# Patient Record
Sex: Male | Born: 1980 | Race: White | Hispanic: No | Marital: Married | Smoking: Never smoker
Health system: Southern US, Community
[De-identification: ages and names within clinical notes are randomized; demographics above are authoritative.]

---

## 2006-06-10 ENCOUNTER — Ambulatory Visit: Payer: Self-pay | Admitting: Internal Medicine

## 2006-06-23 ENCOUNTER — Ambulatory Visit: Payer: Self-pay | Admitting: Internal Medicine

## 2006-06-28 ENCOUNTER — Ambulatory Visit: Payer: Self-pay

## 2006-06-28 ENCOUNTER — Ambulatory Visit: Payer: Self-pay | Admitting: *Deleted

## 2006-06-29 ENCOUNTER — Ambulatory Visit: Payer: Self-pay | Admitting: Internal Medicine

## 2006-07-02 ENCOUNTER — Ambulatory Visit: Payer: Self-pay | Admitting: Internal Medicine

## 2006-07-11 ENCOUNTER — Ambulatory Visit: Payer: Self-pay | Admitting: Cardiology

## 2006-07-11 ENCOUNTER — Ambulatory Visit: Payer: Self-pay | Admitting: Internal Medicine

## 2006-07-14 ENCOUNTER — Ambulatory Visit: Payer: Self-pay | Admitting: Internal Medicine

## 2006-07-26 ENCOUNTER — Ambulatory Visit: Payer: Self-pay | Admitting: Internal Medicine

## 2006-08-12 ENCOUNTER — Ambulatory Visit: Payer: Self-pay | Admitting: Internal Medicine

## 2009-09-02 ENCOUNTER — Telehealth (INDEPENDENT_AMBULATORY_CARE_PROVIDER_SITE_OTHER): Payer: Self-pay | Admitting: *Deleted

## 2016-01-23 ENCOUNTER — Emergency Department (HOSPITAL_COMMUNITY): Payer: Commercial Managed Care - PPO

## 2016-01-23 ENCOUNTER — Encounter (HOSPITAL_COMMUNITY): Admission: EM | Disposition: A | Discharge status: Home / Self Care | Payer: Self-pay | Source: Home / Self Care

## 2016-01-23 ENCOUNTER — Inpatient Hospital Stay (HOSPITAL_COMMUNITY)
Admission: EM | Admit: 2016-01-23 | Discharge: 2016-02-01 | DRG: 958 | Disposition: A | Discharge status: Home / Self Care | Payer: Commercial Managed Care - PPO | Attending: Surgery | Admitting: Surgery

## 2016-01-23 ENCOUNTER — Encounter (HOSPITAL_COMMUNITY): Payer: Self-pay | Admitting: Emergency Medicine

## 2016-01-23 DIAGNOSIS — S01511A Laceration without foreign body of lip, initial encounter: Secondary | ICD-10-CM | POA: Diagnosis present

## 2016-01-23 DIAGNOSIS — S12400A Unspecified displaced fracture of fifth cervical vertebra, initial encounter for closed fracture: Principal | ICD-10-CM | POA: Diagnosis present

## 2016-01-23 DIAGNOSIS — S13171A Dislocation of C6/C7 cervical vertebrae, initial encounter: Secondary | ICD-10-CM | POA: Diagnosis present

## 2016-01-23 DIAGNOSIS — Y92410 Unspecified street and highway as the place of occurrence of the external cause: Secondary | ICD-10-CM

## 2016-01-23 DIAGNOSIS — Y990 Civilian activity done for income or pay: Secondary | ICD-10-CM

## 2016-01-23 DIAGNOSIS — R402433 Glasgow coma scale score 3-8, at hospital admission: Secondary | ICD-10-CM | POA: Diagnosis present

## 2016-01-23 DIAGNOSIS — S01312A Laceration without foreign body of left ear, initial encounter: Secondary | ICD-10-CM | POA: Diagnosis present

## 2016-01-23 DIAGNOSIS — S0181XA Laceration without foreign body of other part of head, initial encounter: Secondary | ICD-10-CM | POA: Diagnosis present

## 2016-01-23 DIAGNOSIS — S2231XA Fracture of one rib, right side, initial encounter for closed fracture: Secondary | ICD-10-CM | POA: Diagnosis present

## 2016-01-23 DIAGNOSIS — S069X9A Unspecified intracranial injury with loss of consciousness of unspecified duration, initial encounter: Secondary | ICD-10-CM | POA: Diagnosis present

## 2016-01-23 DIAGNOSIS — Z419 Encounter for procedure for purposes other than remedying health state, unspecified: Secondary | ICD-10-CM

## 2016-01-23 DIAGNOSIS — M25472 Effusion, left ankle: Secondary | ICD-10-CM

## 2016-01-23 DIAGNOSIS — S271XXA Traumatic hemothorax, initial encounter: Secondary | ICD-10-CM | POA: Diagnosis present

## 2016-01-23 DIAGNOSIS — Y93H2 Activity, gardening and landscaping: Secondary | ICD-10-CM

## 2016-01-23 DIAGNOSIS — R319 Hematuria, unspecified: Secondary | ICD-10-CM | POA: Diagnosis not present

## 2016-01-23 DIAGNOSIS — S22079A Unspecified fracture of T9-T10 vertebra, initial encounter for closed fracture: Secondary | ICD-10-CM | POA: Diagnosis present

## 2016-01-23 DIAGNOSIS — K5903 Drug induced constipation: Secondary | ICD-10-CM | POA: Diagnosis not present

## 2016-01-23 DIAGNOSIS — G629 Polyneuropathy, unspecified: Secondary | ICD-10-CM | POA: Diagnosis present

## 2016-01-23 DIAGNOSIS — S22009A Unspecified fracture of unspecified thoracic vertebra, initial encounter for closed fracture: Secondary | ICD-10-CM

## 2016-01-23 DIAGNOSIS — Z978 Presence of other specified devices: Secondary | ICD-10-CM

## 2016-01-23 DIAGNOSIS — K5981 Ogilvie syndrome: Secondary | ICD-10-CM | POA: Diagnosis not present

## 2016-01-23 DIAGNOSIS — S27329A Contusion of lung, unspecified, initial encounter: Secondary | ICD-10-CM

## 2016-01-23 DIAGNOSIS — R109 Unspecified abdominal pain: Secondary | ICD-10-CM

## 2016-01-23 DIAGNOSIS — S8265XA Nondisplaced fracture of lateral malleolus of left fibula, initial encounter for closed fracture: Secondary | ICD-10-CM | POA: Diagnosis present

## 2016-01-23 DIAGNOSIS — K598 Other specified functional intestinal disorders: Secondary | ICD-10-CM

## 2016-01-23 DIAGNOSIS — S129XXA Fracture of neck, unspecified, initial encounter: Secondary | ICD-10-CM | POA: Diagnosis present

## 2016-01-23 DIAGNOSIS — K567 Ileus, unspecified: Secondary | ICD-10-CM | POA: Diagnosis not present

## 2016-01-23 DIAGNOSIS — M25572 Pain in left ankle and joints of left foot: Secondary | ICD-10-CM

## 2016-01-23 DIAGNOSIS — T40605A Adverse effect of unspecified narcotics, initial encounter: Secondary | ICD-10-CM | POA: Diagnosis not present

## 2016-01-23 DIAGNOSIS — D62 Acute posthemorrhagic anemia: Secondary | ICD-10-CM | POA: Diagnosis present

## 2016-01-23 HISTORY — PX: ANTERIOR CERVICAL DECOMP/DISCECTOMY FUSION: SHX1161

## 2016-01-23 LAB — CBC
HEMATOCRIT: 45.1 % (ref 39.0–52.0)
HEMOGLOBIN: 15.1 g/dL (ref 13.0–17.0)
MCH: 31.2 pg (ref 26.0–34.0)
MCHC: 33.5 g/dL (ref 30.0–36.0)
MCV: 93.2 fL (ref 78.0–100.0)
PLATELETS: 284 10*3/uL (ref 150–400)
RBC: 4.84 MIL/uL (ref 4.22–5.81)
RDW: 12 % (ref 11.5–15.5)
WBC: 13.6 10*3/uL — AB (ref 4.0–10.5)

## 2016-01-23 LAB — PROTIME-INR
INR: 1.14 (ref 0.00–1.49)
Prothrombin Time: 14.8 seconds (ref 11.6–15.2)

## 2016-01-23 LAB — COMPREHENSIVE METABOLIC PANEL
ALT: 79 U/L — AB (ref 17–63)
ANION GAP: 9 (ref 5–15)
AST: 112 U/L — ABNORMAL HIGH (ref 15–41)
Albumin: 4.2 g/dL (ref 3.5–5.0)
Alkaline Phosphatase: 63 U/L (ref 38–126)
BUN: 22 mg/dL — ABNORMAL HIGH (ref 6–20)
CHLORIDE: 105 mmol/L (ref 101–111)
CO2: 25 mmol/L (ref 22–32)
CREATININE: 1.12 mg/dL (ref 0.61–1.24)
Calcium: 9.2 mg/dL (ref 8.9–10.3)
Glucose, Bld: 122 mg/dL — ABNORMAL HIGH (ref 65–99)
POTASSIUM: 3.3 mmol/L — AB (ref 3.5–5.1)
SODIUM: 139 mmol/L (ref 135–145)
Total Bilirubin: 0.7 mg/dL (ref 0.3–1.2)
Total Protein: 6.7 g/dL (ref 6.5–8.1)

## 2016-01-23 LAB — I-STAT CHEM 8, ED
BUN: 27 mg/dL — ABNORMAL HIGH (ref 6–20)
CREATININE: 1.1 mg/dL (ref 0.61–1.24)
Calcium, Ion: 1.14 mmol/L (ref 1.12–1.23)
Chloride: 102 mmol/L (ref 101–111)
GLUCOSE: 111 mg/dL — AB (ref 65–99)
HCT: 49 % (ref 39.0–52.0)
HEMOGLOBIN: 16.7 g/dL (ref 13.0–17.0)
POTASSIUM: 3.3 mmol/L — AB (ref 3.5–5.1)
Sodium: 140 mmol/L (ref 135–145)
TCO2: 26 mmol/L (ref 0–100)

## 2016-01-23 LAB — TYPE AND SCREEN
ABO/RH(D): O POS
ANTIBODY SCREEN: NEGATIVE

## 2016-01-23 LAB — ABO/RH: ABO/RH(D): O POS

## 2016-01-23 LAB — I-STAT CG4 LACTIC ACID, ED: Lactic Acid, Venous: 1.73 mmol/L (ref 0.5–2.0)

## 2016-01-23 SURGERY — ANTERIOR CERVICAL DECOMPRESSION/DISCECTOMY FUSION 1 LEVEL/HARDWARE REMOVAL
Anesthesia: General | Site: Neck

## 2016-01-23 MED ORDER — TETANUS-DIPHTH-ACELL PERTUSSIS 5-2.5-18.5 LF-MCG/0.5 IM SUSP
0.5000 mL | Freq: Once | INTRAMUSCULAR | Status: AC
  Administered 2016-01-23: 0.5 mL via INTRAMUSCULAR
  Filled 2016-01-23: qty 0.5

## 2016-01-23 MED ORDER — HYDROMORPHONE HCL 1 MG/ML IJ SOLN
INTRAMUSCULAR | Status: AC
  Filled 2016-01-23: qty 1

## 2016-01-23 MED ORDER — FENTANYL CITRATE (PF) 100 MCG/2ML IJ SOLN
INTRAMUSCULAR | Status: AC | PRN
  Administered 2016-01-23: 50 ug via INTRAVENOUS

## 2016-01-23 MED ORDER — PROPARACAINE HCL 0.5 % OP SOLN
2.0000 [drp] | Freq: Once | OPHTHALMIC | Status: AC
  Administered 2016-01-23: 2 [drp] via OPHTHALMIC
  Filled 2016-01-23: qty 15

## 2016-01-23 MED ORDER — ONDANSETRON HCL 4 MG/2ML IJ SOLN
4.0000 mg | Freq: Four times a day (QID) | INTRAMUSCULAR | Status: DC | PRN
  Administered 2016-01-27 – 2016-01-28 (×2): 4 mg via INTRAVENOUS
  Filled 2016-01-23 (×2): qty 2

## 2016-01-23 MED ORDER — HYDROMORPHONE HCL 1 MG/ML IJ SOLN: 1.0000 mg | INTRAMUSCULAR | Status: DC | PRN

## 2016-01-23 MED ORDER — CEFAZOLIN SODIUM 1-5 GM-% IV SOLN
1.0000 g | Freq: Once | INTRAVENOUS | Status: AC
  Administered 2016-01-23: 1 g via INTRAVENOUS
  Filled 2016-01-23: qty 50

## 2016-01-23 MED ORDER — FENTANYL CITRATE (PF) 100 MCG/2ML IJ SOLN
INTRAMUSCULAR | Status: AC
  Filled 2016-01-23: qty 2

## 2016-01-23 MED ORDER — LIDOCAINE HCL (PF) 1 % IJ SOLN
30.0000 mL | Freq: Once | INTRAMUSCULAR | Status: AC
  Administered 2016-01-24: 30 mL
  Filled 2016-01-23: qty 30

## 2016-01-23 MED ORDER — HYDROMORPHONE HCL 1 MG/ML IJ SOLN
1.0000 mg | INTRAMUSCULAR | Status: DC | PRN
  Filled 2016-01-23 (×2): qty 1

## 2016-01-23 MED ORDER — FENTANYL CITRATE (PF) 100 MCG/2ML IJ SOLN
50.0000 ug | Freq: Once | INTRAMUSCULAR | Status: AC
  Administered 2016-01-24: 150 ug via INTRAVENOUS

## 2016-01-23 MED ORDER — HYDROMORPHONE HCL 1 MG/ML IJ SOLN
INTRAMUSCULAR | Status: AC | PRN
  Administered 2016-01-23: 1 mg via INTRAVENOUS

## 2016-01-23 MED ORDER — ONDANSETRON HCL 4 MG PO TABS
4.0000 mg | ORAL_TABLET | Freq: Four times a day (QID) | ORAL | Status: DC | PRN
  Administered 2016-02-01: 4 mg via ORAL
  Filled 2016-01-23: qty 1

## 2016-01-23 MED ORDER — LIDOCAINE HCL (PF) 1 % IJ SOLN: 5.0000 mL | Freq: Once | INTRAMUSCULAR | Status: DC

## 2016-01-23 MED ORDER — MIDAZOLAM HCL 2 MG/2ML IJ SOLN
INTRAMUSCULAR | Status: AC
  Filled 2016-01-23: qty 2

## 2016-01-23 MED ORDER — PROPOFOL 10 MG/ML IV BOLUS
INTRAVENOUS | Status: AC
  Filled 2016-01-23: qty 20

## 2016-01-23 MED ORDER — HYDROMORPHONE HCL 1 MG/ML IJ SOLN
1.0000 mg | Freq: Once | INTRAMUSCULAR | Status: AC
  Administered 2016-01-23: 1 mg via INTRAVENOUS

## 2016-01-23 MED ORDER — FENTANYL CITRATE (PF) 250 MCG/5ML IJ SOLN
INTRAMUSCULAR | Status: AC
  Filled 2016-01-23: qty 5

## 2016-01-23 MED ORDER — IOPAMIDOL (ISOVUE-300) INJECTION 61%
INTRAVENOUS | Status: AC
  Administered 2016-01-23: 100 mL
  Filled 2016-01-23: qty 100

## 2016-01-23 MED ORDER — HYDROMORPHONE HCL 1 MG/ML IJ SOLN: 1.0000 mg | Freq: Once | INTRAMUSCULAR | Status: DC

## 2016-01-23 SURGICAL SUPPLY — 75 items
BIT DRILL NEURO 2X3.1 SFT TUCH (MISCELLANEOUS) ×2 IMPLANT
BLADE CLIPPER SURG (BLADE) IMPLANT
BNDG GAUZE ELAST 4 BULKY (GAUZE/BANDAGES/DRESSINGS) IMPLANT
BUR DRUM 4.0 (BURR) IMPLANT
BUR DRUM 4.0MM (BURR)
CANISTER SUCT 3000ML PPV (MISCELLANEOUS) ×4 IMPLANT
DECANTER SPIKE VIAL GLASS SM (MISCELLANEOUS) ×4 IMPLANT
DERMABOND ADVANCED (GAUZE/BANDAGES/DRESSINGS) ×2
DERMABOND ADVANCED .7 DNX12 (GAUZE/BANDAGES/DRESSINGS) ×2 IMPLANT
DRAPE C-ARM 42X72 X-RAY (DRAPES) ×8 IMPLANT
DRAPE LAPAROTOMY 100X72 PEDS (DRAPES) ×4 IMPLANT
DRAPE LAPAROTOMY 100X72X124 (DRAPES) ×4 IMPLANT
DRAPE MICROSCOPE LEICA (MISCELLANEOUS) ×4 IMPLANT
DRAPE POUCH INSTRU U-SHP 10X18 (DRAPES) ×4 IMPLANT
DRAPE PROXIMA HALF (DRAPES) ×4 IMPLANT
DRILL NEURO 2X3.1 SOFT TOUCH (MISCELLANEOUS) ×4
DURAPREP 6ML APPLICATOR 50/CS (WOUND CARE) ×4 IMPLANT
DURASEAL APPLICATOR TIP (TIP) ×4 IMPLANT
DURASEAL SPINE SEALANT 3ML (MISCELLANEOUS) ×4 IMPLANT
ELECT COATED BLADE 2.86 ST (ELECTRODE) ×4 IMPLANT
ELECT REM PT RETURN 9FT ADLT (ELECTROSURGICAL) ×4
ELECTRODE REM PT RTRN 9FT ADLT (ELECTROSURGICAL) ×2 IMPLANT
GAUZE SPONGE 4X4 16PLY XRAY LF (GAUZE/BANDAGES/DRESSINGS) IMPLANT
GLOVE BIO SURGEON STRL SZ 6.5 (GLOVE) IMPLANT
GLOVE BIO SURGEON STRL SZ7 (GLOVE) ×8 IMPLANT
GLOVE BIO SURGEON STRL SZ7.5 (GLOVE) ×4 IMPLANT
GLOVE BIO SURGEON STRL SZ8 (GLOVE) IMPLANT
GLOVE BIO SURGEON STRL SZ8.5 (GLOVE) IMPLANT
GLOVE BIO SURGEONS STRL SZ 6.5 (GLOVE)
GLOVE BIOGEL M 8.0 STRL (GLOVE) IMPLANT
GLOVE ECLIPSE 6.5 STRL STRAW (GLOVE) ×4 IMPLANT
GLOVE ECLIPSE 7.0 STRL STRAW (GLOVE) IMPLANT
GLOVE ECLIPSE 7.5 STRL STRAW (GLOVE) IMPLANT
GLOVE ECLIPSE 8.0 STRL XLNG CF (GLOVE) IMPLANT
GLOVE ECLIPSE 8.5 STRL (GLOVE) IMPLANT
GLOVE EXAM NITRILE LRG STRL (GLOVE) IMPLANT
GLOVE EXAM NITRILE MD LF STRL (GLOVE) IMPLANT
GLOVE EXAM NITRILE XL STR (GLOVE) IMPLANT
GLOVE EXAM NITRILE XS STR PU (GLOVE) IMPLANT
GLOVE INDICATOR 6.5 STRL GRN (GLOVE) ×4 IMPLANT
GLOVE INDICATOR 7.0 STRL GRN (GLOVE) IMPLANT
GLOVE INDICATOR 7.5 STRL GRN (GLOVE) ×4 IMPLANT
GLOVE INDICATOR 8.0 STRL GRN (GLOVE) IMPLANT
GLOVE INDICATOR 8.5 STRL (GLOVE) IMPLANT
GLOVE OPTIFIT SS 8.0 STRL (GLOVE) IMPLANT
GLOVE SURG SS PI 6.5 STRL IVOR (GLOVE) IMPLANT
GOWN STRL REUS W/ TWL LRG LVL3 (GOWN DISPOSABLE) ×4 IMPLANT
GOWN STRL REUS W/ TWL XL LVL3 (GOWN DISPOSABLE) IMPLANT
GOWN STRL REUS W/TWL 2XL LVL3 (GOWN DISPOSABLE) IMPLANT
GOWN STRL REUS W/TWL LRG LVL3 (GOWN DISPOSABLE) ×4
GOWN STRL REUS W/TWL XL LVL3 (GOWN DISPOSABLE)
GRAFT DURAGEN MATRIX 1WX1L (Tissue) ×4 IMPLANT
KIT BASIN OR (CUSTOM PROCEDURE TRAY) ×4 IMPLANT
KIT ROOM TURNOVER OR (KITS) ×4 IMPLANT
LIQUID BAND (GAUZE/BANDAGES/DRESSINGS) ×4 IMPLANT
NEEDLE HYPO 25X1 1.5 SAFETY (NEEDLE) ×4 IMPLANT
NEEDLE SPNL 22GX3.5 QUINCKE BK (NEEDLE) ×4 IMPLANT
NS IRRIG 1000ML POUR BTL (IV SOLUTION) ×4 IMPLANT
PACK LAMINECTOMY NEURO (CUSTOM PROCEDURE TRAY) ×4 IMPLANT
PAD ARMBOARD 7.5X6 YLW CONV (MISCELLANEOUS) ×12 IMPLANT
PIN DISTRACTION 14MM (PIN) ×8 IMPLANT
PLATE LEVEL 1 TI VECTRA 14MM (Plate) ×4 IMPLANT
RUBBERBAND STERILE (MISCELLANEOUS) ×8 IMPLANT
SCREW SELF TAP 4.0X14MM (Screw) ×16 IMPLANT
SPACER ACF PARALLEL 7MM (Bone Implant) ×4 IMPLANT
SPONGE INTESTINAL PEANUT (DISPOSABLE) ×4 IMPLANT
SPONGE SURGIFOAM ABS GEL 100 (HEMOSTASIS) ×4 IMPLANT
SPONGE SURGIFOAM ABS GEL SZ50 (HEMOSTASIS) ×4 IMPLANT
SUT MNCRL AB 4-0 PS2 18 (SUTURE) ×4 IMPLANT
SUT VIC AB 0 CT1 27 (SUTURE) ×2
SUT VIC AB 0 CT1 27XBRD ANTBC (SUTURE) ×2 IMPLANT
SUT VIC AB 3-0 SH 8-18 (SUTURE) ×4 IMPLANT
TOWEL OR 17X24 6PK STRL BLUE (TOWEL DISPOSABLE) ×8 IMPLANT
TOWEL OR 17X26 10 PK STRL BLUE (TOWEL DISPOSABLE) ×4 IMPLANT
WATER STERILE IRR 1000ML POUR (IV SOLUTION) ×4 IMPLANT

## 2016-01-23 NOTE — ED Notes (Signed)
Family at beside. Family given emotional support. 

## 2016-01-23 NOTE — ED Notes (Signed)
Bladder scan: 460ml 

## 2016-01-23 NOTE — ED Notes (Signed)
Ian MalkinZach, RN and Everlena CooperMica, RN to take over care of pt in ED, Pt and family updated. 72M RN updated on status of pt as well.

## 2016-01-23 NOTE — ED Notes (Signed)
RN to accompany patient to CT.
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
## 2016-01-23 NOTE — Consult Note (Signed)
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 line or displaced fracture fragment is identified within the cervical spine. Overall alignment of the cervical spine is otherwise normal. IMPRESSION: 1. No evidence of acute intracranial abnormality. No intracranial mass, hemorrhage or edema. 2. Multiple small foreign bodies along the skin surface overlying the lower frontal bones and facial bones, as detailed above. This includes a tiny (2-3 mm) foreign body along the lateral margin of the right orbital globe. This also includes a 6 mm foreign body overlying the anterior-lateral margin of the right orbital globe. Right orbital globe itself appears grossly intact and normal in configuration. 3. No facial bone fracture or displacement seen. 4. Soft tissue edema/lacerations over the anterior margin of the mandible, with multiple small foreign bodies along the skin surface. 5. Displaced fracture along the posterior margin of the inferior right C5 facet. There is a unilateral right-sided perched facet at this level. There is associated minimal anterior displacement of the C5 vertebral body. No other fracture or dislocation seen within the cervical spine. These results were called by telephone at the time of interpretation on 01/23/2016 at 9:27 pm to Dr. Jola Schmidt , who verbally acknowledged these results. Electronically Signed   By: Franki Cabot M.D.   On: 01/23/2016 21:30    Review of Systems  Constitutional: Negative.   Eyes: Negative.   Respiratory: Negative.   Cardiovascular: Negative.   Gastrointestinal: Negative.   Genitourinary: Negative.   Musculoskeletal: Positive for back pain and neck pain.  Skin: Negative.   Neurological: Negative.   Endo/Heme/Allergies: Negative.   Psychiatric/Behavioral: Negative.    Blood pressure 113/67, pulse 81, temperature 97.9 F (36.6 C), temperature source Oral, resp. rate 13, height 5' 10"  (1.778 m), weight 84.369 kg (186 lb), SpO2 99 %. Physical Exam  Constitutional: He is oriented to person, place, and time. He appears well-developed and well-nourished. He appears distressed.  HENT:  Head: Normocephalic.  Mouth/Throat: Oropharynx is clear and moist.  Dried blood in hair and on face  Chin laceration Right cervical laceration  Eyes: Conjunctivae and EOM are normal. Pupils are equal, round, and reactive to light.  Neck:  In cervical collar  Cardiovascular: Normal rate, regular rhythm, normal heart sounds and intact distal pulses.   Respiratory: Effort normal and breath sounds normal.  GI: Soft. Bowel sounds are normal.  Musculoskeletal: Normal range of motion.  Neck pain   Neurological: He is alert and oriented to person, place, and time. He has normal reflexes. He displays normal reflexes. No cranial nerve deficit. He exhibits normal muscle tone. Coordination normal.  Skin: Skin is warm and dry. He is not diaphoretic.  Psychiatric: He has a normal mood and affect. His behavior is normal. Judgment and thought content normal.    Assessment/Plan: OR for ORIF C5/6 via an ACDF. Will brace T9 fracture, do not believe this will need surgical stabilization. Risks and benefits of the procedure were explained to the patient. Bleeding, infection, fusion failure, hardware failure, weakness in the  upper and or lower extremities , paralysis, damage to the vocal cords, need for further surgery, and other risks were explained. He and his family understood and wished to proceed.   Kalan Yeley L 01/23/2016, 11:30 PM
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
## 2016-01-23 NOTE — ED Notes (Signed)
Vital signs stable. 

## 2016-01-23 NOTE — ED Notes (Signed)
C-spine maintained while transferring pt to ct table and back to stretcher.

## 2016-01-23 NOTE — ED Notes (Signed)
Spoke with Dr. Patria Maneampos, patient is to go to OR from ED.
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
## 2016-01-24 ENCOUNTER — Inpatient Hospital Stay (HOSPITAL_COMMUNITY): Payer: Commercial Managed Care - PPO

## 2016-01-24 ENCOUNTER — Inpatient Hospital Stay (HOSPITAL_COMMUNITY): Payer: Commercial Managed Care - PPO | Admitting: Anesthesiology

## 2016-01-24 DIAGNOSIS — S13171A Dislocation of C6/C7 cervical vertebrae, initial encounter: Secondary | ICD-10-CM | POA: Diagnosis present

## 2016-01-24 LAB — URINALYSIS, ROUTINE W REFLEX MICROSCOPIC
Bilirubin Urine: NEGATIVE
GLUCOSE, UA: NEGATIVE mg/dL
KETONES UR: NEGATIVE mg/dL
LEUKOCYTES UA: NEGATIVE
NITRITE: NEGATIVE
PROTEIN: NEGATIVE mg/dL
Specific Gravity, Urine: 1.019 (ref 1.005–1.030)
pH: 7.5 (ref 5.0–8.0)

## 2016-01-24 LAB — POCT I-STAT 3, ART BLOOD GAS (G3+)
Acid-Base Excess: 2 mmol/L (ref 0.0–2.0)
BICARBONATE: 27 meq/L — AB (ref 20.0–24.0)
O2 Saturation: 99 %
PO2 ART: 158 mmHg — AB (ref 80.0–100.0)
TCO2: 28 mmol/L (ref 0–100)
pCO2 arterial: 43.6 mmHg (ref 35.0–45.0)
pH, Arterial: 7.399 (ref 7.350–7.450)

## 2016-01-24 LAB — BASIC METABOLIC PANEL
Anion gap: 7 (ref 5–15)
BUN: 19 mg/dL (ref 6–20)
CALCIUM: 8.6 mg/dL — AB (ref 8.9–10.3)
CHLORIDE: 105 mmol/L (ref 101–111)
CO2: 26 mmol/L (ref 22–32)
CREATININE: 1 mg/dL (ref 0.61–1.24)
GFR calc Af Amer: >60 mL/min (ref >60)
GFR calc non Af Amer: >60 mL/min (ref >60)
Glucose, Bld: 132 mg/dL — ABNORMAL HIGH (ref 65–99)
Potassium: 4.7 mmol/L (ref 3.5–5.1)
SODIUM: 138 mmol/L (ref 135–145)

## 2016-01-24 LAB — URINE MICROSCOPIC-ADD ON: WBC UA: NONE SEEN WBC/hpf (ref 0–5)

## 2016-01-24 LAB — CBC
HCT: 36.5 % — ABNORMAL LOW (ref 39.0–52.0)
HEMOGLOBIN: 12 g/dL — AB (ref 13.0–17.0)
MCH: 30.8 pg (ref 26.0–34.0)
MCHC: 32.9 g/dL (ref 30.0–36.0)
MCV: 93.8 fL (ref 78.0–100.0)
Platelets: 229 10*3/uL (ref 150–400)
RBC: 3.89 MIL/uL — ABNORMAL LOW (ref 4.22–5.81)
RDW: 12.2 % (ref 11.5–15.5)
WBC: 9.9 10*3/uL (ref 4.0–10.5)

## 2016-01-24 LAB — MRSA PCR SCREENING: MRSA BY PCR: NEGATIVE

## 2016-01-24 LAB — ETHANOL: Alcohol, Ethyl (B): <5 mg/dL (ref <5)

## 2016-01-24 LAB — TRIGLYCERIDES: TRIGLYCERIDES: 66 mg/dL (ref <150)

## 2016-01-24 MED ORDER — DIPHENHYDRAMINE HCL 12.5 MG/5ML PO ELIX: 12.5000 mg | ORAL_SOLUTION | Freq: Four times a day (QID) | ORAL | Status: DC | PRN

## 2016-01-24 MED ORDER — NALOXONE HCL 0.4 MG/ML IJ SOLN: 0.4000 mg | INTRAMUSCULAR | Status: DC | PRN

## 2016-01-24 MED ORDER — FENTANYL CITRATE (PF) 2500 MCG/50ML IJ SOLN
25.0000 ug/h | INTRAMUSCULAR | Status: DC
  Administered 2016-01-24: 25 ug/h via INTRAVENOUS
  Filled 2016-01-24: qty 50

## 2016-01-24 MED ORDER — FENTANYL BOLUS VIA INFUSION
50.0000 ug | INTRAVENOUS | Status: DC | PRN
Start: 2016-01-24 — End: 2016-01-24
  Filled 2016-01-24: qty 50

## 2016-01-24 MED ORDER — PROPOFOL 10 MG/ML IV BOLUS
INTRAVENOUS | Status: DC | PRN
  Administered 2016-01-24: 130 mg via INTRAVENOUS
  Administered 2016-01-24: 50 mg via INTRAVENOUS
  Administered 2016-01-24: 20 mg via INTRAVENOUS

## 2016-01-24 MED ORDER — MIDAZOLAM HCL 2 MG/2ML IJ SOLN
INTRAMUSCULAR | Status: AC
  Filled 2016-01-24: qty 2

## 2016-01-24 MED ORDER — SODIUM CHLORIDE 0.9 % IV SOLN
INTRAVENOUS | Status: DC
  Administered 2016-01-24 – 2016-01-25 (×3): via INTRAVENOUS

## 2016-01-24 MED ORDER — CEFAZOLIN SODIUM-DEXTROSE 2-3 GM-% IV SOLR
INTRAVENOUS | Status: DC | PRN
  Administered 2016-01-24: 2 g via INTRAVENOUS

## 2016-01-24 MED ORDER — 0.9 % SODIUM CHLORIDE (POUR BTL) OPTIME
TOPICAL | Status: DC | PRN
  Administered 2016-01-24: 1000 mL

## 2016-01-24 MED ORDER — SUCCINYLCHOLINE CHLORIDE 20 MG/ML IJ SOLN
INTRAMUSCULAR | Status: DC | PRN
  Administered 2016-01-24: 60 mg via INTRAVENOUS

## 2016-01-24 MED ORDER — KETOROLAC TROMETHAMINE 30 MG/ML IJ SOLN
30.0000 mg | Freq: Once | INTRAMUSCULAR | Status: AC
  Administered 2016-01-24: 30 mg via INTRAVENOUS
  Filled 2016-01-24: qty 1

## 2016-01-24 MED ORDER — OXYCODONE HCL 5 MG PO TABS
5.0000 mg | ORAL_TABLET | ORAL | Status: DC | PRN
  Administered 2016-01-24 (×2): 5 mg via ORAL
  Administered 2016-01-25 – 2016-01-26 (×3): 10 mg via ORAL
  Filled 2016-01-24: qty 1
  Filled 2016-01-24: qty 2
  Filled 2016-01-24: qty 1
  Filled 2016-01-24 (×2): qty 2

## 2016-01-24 MED ORDER — MORPHINE SULFATE 2 MG/ML IV SOLN
INTRAVENOUS | Status: DC
  Administered 2016-01-24: 15 mg via INTRAVENOUS
  Administered 2016-01-24: 9 mg via INTRAVENOUS
  Administered 2016-01-24: 14:00:00 via INTRAVENOUS
  Administered 2016-01-25: 9 mg via INTRAVENOUS
  Administered 2016-01-25: 1.5 mg via INTRAVENOUS
  Administered 2016-01-25: 11.11 mg via INTRAVENOUS
  Administered 2016-01-25: 7.5 mg via INTRAVENOUS
  Administered 2016-01-25: 15 mg via INTRAVENOUS
  Administered 2016-01-25: 9 mg via INTRAVENOUS
  Administered 2016-01-26: 7.5 mg via INTRAVENOUS
  Administered 2016-01-26: 2 mg via INTRAVENOUS
  Filled 2016-01-24 (×3): qty 25

## 2016-01-24 MED ORDER — ONDANSETRON HCL 4 MG/2ML IJ SOLN: 4.0000 mg | Freq: Four times a day (QID) | INTRAMUSCULAR | Status: DC | PRN

## 2016-01-24 MED ORDER — ANTISEPTIC ORAL RINSE SOLUTION (CORINZ)
7.0000 mL | OROMUCOSAL | Status: DC
  Administered 2016-01-24 (×4): 7 mL via OROMUCOSAL

## 2016-01-24 MED ORDER — PHENYLEPHRINE HCL 10 MG/ML IJ SOLN
INTRAMUSCULAR | Status: DC | PRN
  Administered 2016-01-24 (×2): 80 ug via INTRAVENOUS
  Administered 2016-01-24: 120 ug via INTRAVENOUS
  Administered 2016-01-24 (×2): 80 ug via INTRAVENOUS

## 2016-01-24 MED ORDER — CHLORHEXIDINE GLUCONATE 0.12 % MT SOLN
15.0000 mL | Freq: Two times a day (BID) | OROMUCOSAL | Status: DC
  Administered 2016-01-24 – 2016-01-25 (×2): 15 mL via OROMUCOSAL
  Filled 2016-01-24 (×2): qty 15

## 2016-01-24 MED ORDER — THROMBIN 20000 UNITS EX SOLR
CUTANEOUS | Status: DC | PRN
  Administered 2016-01-24: 20 mL

## 2016-01-24 MED ORDER — PROPOFOL 1000 MG/100ML IV EMUL
0.0000 ug/kg/min | INTRAVENOUS | Status: DC
  Administered 2016-01-24: 40 ug/kg/min via INTRAVENOUS
  Filled 2016-01-24: qty 100

## 2016-01-24 MED ORDER — DIAZEPAM 5 MG PO TABS
5.0000 mg | ORAL_TABLET | Freq: Four times a day (QID) | ORAL | Status: DC | PRN
  Administered 2016-01-26: 5 mg via ORAL
  Filled 2016-01-24: qty 1

## 2016-01-24 MED ORDER — ALPRAZOLAM 0.5 MG PO TABS
0.5000 mg | ORAL_TABLET | Freq: Three times a day (TID) | ORAL | Status: DC
  Administered 2016-01-24 – 2016-01-27 (×10): 0.5 mg via ORAL
  Filled 2016-01-24 (×10): qty 1

## 2016-01-24 MED ORDER — DIPHENHYDRAMINE HCL 50 MG/ML IJ SOLN
12.5000 mg | Freq: Four times a day (QID) | INTRAMUSCULAR | Status: DC | PRN
  Administered 2016-01-25 (×3): 12.5 mg via INTRAVENOUS
  Filled 2016-01-24 (×3): qty 1

## 2016-01-24 MED ORDER — MIDAZOLAM HCL 5 MG/5ML IJ SOLN
INTRAMUSCULAR | Status: DC | PRN
  Administered 2016-01-24 (×2): 2 mg via INTRAVENOUS

## 2016-01-24 MED ORDER — DEXAMETHASONE SODIUM PHOSPHATE 10 MG/ML IJ SOLN
INTRAMUSCULAR | Status: DC | PRN
  Administered 2016-01-24: 10 mg via INTRAVENOUS

## 2016-01-24 MED ORDER — CHLORHEXIDINE GLUCONATE 0.12% ORAL RINSE (MEDLINE KIT)
15.0000 mL | Freq: Two times a day (BID) | OROMUCOSAL | Status: DC
  Administered 2016-01-24: 15 mL via OROMUCOSAL

## 2016-01-24 MED ORDER — SODIUM CHLORIDE 0.9% FLUSH: 9.0000 mL | INTRAVENOUS | Status: DC | PRN

## 2016-01-24 MED ORDER — FENTANYL CITRATE (PF) 100 MCG/2ML IJ SOLN: 50.0000 ug | Freq: Once | INTRAMUSCULAR | Status: DC

## 2016-01-24 MED ORDER — LIDOCAINE HCL (CARDIAC) 20 MG/ML IV SOLN
INTRAVENOUS | Status: DC | PRN
  Administered 2016-01-24: 60 mg via INTRAVENOUS

## 2016-01-24 MED ORDER — LACTATED RINGERS IV SOLN
INTRAVENOUS | Status: DC | PRN
  Administered 2016-01-24 (×4): via INTRAVENOUS

## 2016-01-24 MED ORDER — FENTANYL 40 MCG/ML IV SOLN
INTRAVENOUS | Status: DC
  Administered 2016-01-24: 12:00:00 via INTRAVENOUS
  Administered 2016-01-24: 105 ug via INTRAVENOUS
  Filled 2016-01-24: qty 25

## 2016-01-24 MED ORDER — CETYLPYRIDINIUM CHLORIDE 0.05 % MT LIQD
7.0000 mL | Freq: Two times a day (BID) | OROMUCOSAL | Status: DC
  Administered 2016-01-25 – 2016-01-30 (×9): 7 mL via OROMUCOSAL

## 2016-01-24 MED ORDER — PANTOPRAZOLE SODIUM 20 MG PO TBEC
20.0000 mg | DELAYED_RELEASE_TABLET | Freq: Every day | ORAL | Status: DC
  Administered 2016-01-24 – 2016-01-27 (×4): 20 mg via ORAL
  Filled 2016-01-24 (×7): qty 1

## 2016-01-24 MED ORDER — LIDOCAINE-EPINEPHRINE 0.5 %-1:200000 IJ SOLN
INTRAMUSCULAR | Status: DC | PRN
  Administered 2016-01-24: 4 mL via INTRADERMAL

## 2016-01-24 MED ORDER — ROCURONIUM BROMIDE 100 MG/10ML IV SOLN
INTRAVENOUS | Status: DC | PRN
  Administered 2016-01-24: 40 mg via INTRAVENOUS
  Administered 2016-01-24: 10 mg via INTRAVENOUS
  Administered 2016-01-24: 50 mg via INTRAVENOUS

## 2016-01-24 NOTE — Progress Notes (Signed)
Patient reports that pain is not controlled with use of Fentanyl PCA. Dr. Magnus IvanBlackman notified. Orders received for prn Oxycodone, one time dose of Toradol IV and to change to a Morphine PCA. Will continue to monitor.
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
## 2016-01-24 NOTE — Progress Notes (Signed)
Orthopedic Tech Progress Note Patient Details:  Jerry Chen 01/13/1981 [id number]  Patient ID: Jerry Chen, male   DOB: 10/19/1980, 35 y.o.   MRN: [id number] Called in bio-tech brace order; spoke with [name]  [name] 01/24/2016, 9:39 AM
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
## 2016-01-24 NOTE — Procedures (Signed)
Extubation Procedure Note  Patient Details:   Name: Jerry Chen DOB: 09/26/1981 MRN: [id number]   Airway Documentation:  Airway 7.5 mm (Active)  Secured at (cm) 23 cm 01/24/2016  9:32 AM  Measured From Lips 01/24/2016  9:32 AM  Secured Location Left 01/24/2016  9:32 AM  Secured By [employer] Tube Holder 01/24/2016  9:32 AM  Tube Holder Repositioned Yes 01/24/2016  7:04 AM  Cuff Pressure (cm H2O) 26 cm H2O 01/24/2016  3:25 AM  Site Condition Dry 01/24/2016  9:32 AM    Evaluation  O2 sats: stable throughout Complications: No apparent complications Patient did tolerate procedure well. Bilateral Breath Sounds: Clear   Yes   PT. Was extubated to a 3L [place] without any complications, dyspnea or stridor noted. Pt. Was instructed on IS x 5, highest goal achieved as 900mL.   Jerry Chen, Jerry Chen 01/24/2016, 11:39 PM

## 2016-01-24 NOTE — Anesthesia Procedure Notes (Signed)
Procedure Name: Intubation Date/Time: 01/24/2016 12:46 AM Performed by: Arlice ColtMANESS, Coyt Govoni B Pre-anesthesia Checklist: Patient identified, Emergency Drugs available, Suction available, Patient being monitored and Timeout performed Patient Re-evaluated:Patient Re-evaluated prior to inductionOxygen Delivery Method: Circle system utilized Preoxygenation: Pre-oxygenation with 100% oxygen Intubation Type: IV induction and Rapid sequence Grade View: Grade I Tube type: Oral Tube size: 7.5 mm Number of attempts: 1 Airway Equipment and Method: Stylet and Video-laryngoscopy (elective glidescope) Placement Confirmation: breath sounds checked- equal and bilateral,  positive ETCO2 and ETT inserted through vocal cords under direct vision Secured at: 23 cm Tube secured with: Tape Dental Injury: Teeth and Oropharynx as per pre-operative assessment
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
## 2016-01-24 NOTE — Transfer of Care (Signed)
Immediate Anesthesia Transfer of Care Note  Patient: Jerry Chen  Procedure(s) Performed: Procedure(s): ANTERIOR CERVICAL DECOMPRESSION/DISCECTOMY FUSION Cervical five - six (N/A)  Patient Location: ICU  Anesthesia Type:General  Level of Consciousness: Patient remains intubated per anesthesia plan  Airway & Oxygen Therapy: Patient remains intubated per anesthesia plan and Patient placed on Ventilator (see vital sign flow sheet for setting)  Post-op Assessment: Report given to RN and Post -op Vital signs reviewed and stable  Post vital signs: Reviewed and stable  Last Vitals:  Filed Vitals:   01/24/16 0020 01/24/16 0325  BP: 110/63 108/57  Pulse: 82 70  Temp:    Resp: 10 14    Last Pain:  Filed Vitals:   01/24/16 0326  PainSc: 9          Complications: No apparent anesthesia complications

## 2016-01-24 NOTE — Progress Notes (Signed)
Patient ID: Jerry SpeedJimmy Chen, male   DOB: 06/28/1981, 35 y.o.   MRN: [id number] BP 112/66 mmHg  Pulse 82  Temp(Src) 100.2 F (37.9 C) (Axillary)  Resp 15  Ht 5\' 10"  (1.778 m)  Wt 84.369 kg (186 lb)  BMI 26.69 kg/m2  SpO2 100% Alert and moving all extremities, mild weakness right upper extremity Wound is clean, dry, no signs of infection Extubated, breathing well.

## 2016-01-24 NOTE — Progress Notes (Signed)
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 acknowledged these results. Electronically Signed   By: Bary Richard M.D.   On: 01/23/2016 21:30    Anti-infectives: Anti-infectives    Start     Dose/Rate Route Frequency Ordered Stop   01/23/16 2015  ceFAZolin (ANCEF) IVPB 1 g/50 mL premix     1 g 100 mL/hr over 30 Minutes Intravenous  Once 01/23/16 2002 01/23/16 2142      Assessment/Plan: s/p Procedure(s): ANTERIOR CERVICAL DECOMPRESSION/DISCECTOMY FUSION Cervical five - six (N/A)  Ween vent, extubate if stable Check CXR PCA for pain control  LOS: 1 day    Iliza Blankenbeckler A 01/24/2016

## 2016-01-24 NOTE — ED Notes (Signed)
Dr. campos at the bedside.  

## 2016-01-24 NOTE — Progress Notes (Signed)
Wasted fentanyl drip remainder in sink (230 cc) with Sullivan LoneJamie White RN as witness.   Holly Bodilyulbertson, Bethany Leigh
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
## 2016-01-24 NOTE — Anesthesia Postprocedure Evaluation (Signed)
Anesthesia Post Note  Patient: Jerry Chen  Procedure(s) Performed: Procedure(s) (LRB): ANTERIOR CERVICAL DECOMPRESSION/DISCECTOMY FUSION Cervical five - six (N/A)  Patient location during evaluation: ICU Anesthesia Type: General Level of consciousness: sedated Pain management: pain level controlled Vital Signs Assessment: post-procedure vital signs reviewed and stable Respiratory status: patient remains intubated per anesthesia plan Cardiovascular status: stable Postop Assessment: no signs of nausea or vomiting Anesthetic complications: no    Last Vitals:  Filed Vitals:   01/24/16 0020 01/24/16 0325  BP: 110/63 108/57  Pulse: 82 70  Temp:    Resp: 10 14    Last Pain:  Filed Vitals:   01/24/16 0326  PainSc: 9                  Jerry Chen

## 2016-01-25 ENCOUNTER — Inpatient Hospital Stay (HOSPITAL_COMMUNITY): Payer: Commercial Managed Care - PPO

## 2016-01-25 MED ORDER — SODIUM CHLORIDE 0.9 % IV SOLN
INTRAVENOUS | Status: DC
  Administered 2016-01-25: 23:00:00 via INTRAVENOUS

## 2016-01-25 MED ORDER — ACETAMINOPHEN 325 MG PO TABS
650.0000 mg | ORAL_TABLET | Freq: Four times a day (QID) | ORAL | Status: DC | PRN
  Administered 2016-01-25 – 2016-01-26 (×2): 650 mg via ORAL
  Filled 2016-01-25 (×2): qty 2

## 2016-01-25 NOTE — Progress Notes (Signed)
Trauma Service Note  Subjective: Walking in halls, tolerated diet yesterday, pain issues still mainly in back, parathesias subjectively improving  Objective: Vital signs in last 24 hours: Temp:  [98 F (36.7 C)-100.2 F (37.9 C)] 98 F (36.7 C) (04/30 0454) Pulse Rate:  [36-102] 82 (04/30 0747) Resp:  [9-33] 12 (04/30 0747) BP: (101-124)/(54-75) 107/54 mmHg (04/30 0747) SpO2:  [83 %-100 %] 100 % (04/30 0747) FiO2 (%):  [40 %] 40 % (04/29 1100) Last BM Date: 01/22/16  Intake/Output from previous day: 04/29 0701 - 04/30 0700 In: 2171.4 [P.O.:320; I.V.:1851.4] Out: 2265 [Urine:2265] Intake/Output this shift:    General: NAD  Lungs: CTAB  Abd: soft, NT, ND  Extremities: moves all extremities  Neuro: AOx4  Lab Results: CBC   Recent Labs  01/23/16 2025 01/24/16 0346  WBC 13.6* 9.9  HGB 15.1 12.0*  HCT 45.1 36.5*  PLT 284 229   BMET  Recent Labs  01/23/16 2025 01/24/16 0346  NA 139 138  K 3.3* 4.7  CL 105 105  CO2 25 26  GLUCOSE 122* 132*  BUN 22* 19  CREATININE 1.12 1.00  CALCIUM 9.2 8.6*   PT/INR  Recent Labs  01/23/16 2025  LABPROT 14.8  INR 1.14   ABG  Recent Labs  01/24/16 0505  PHART 7.399  HCO3 27.0*    Studies/Results: Dg Chest Port 1 View  01/24/2016  CLINICAL DATA:  Intubation EXAM: PORTABLE CHEST 1 VIEW COMPARISON:  Chest x-ray from earlier same day. FINDINGS: Endotracheal tube tip located above the level of the clavicles, approximately 6 cm above the carina. Heart size is normal. Overall cardiomediastinal silhouette is stable in the short-term interval. Lungs remain clear. No pleural effusion or pneumothorax seen. Osseous structures about the chest are unremarkable. IMPRESSION: Endotracheal tube positioned with tip just above the level of the clavicles and approximately 6 cm above the carina. Otherwise stable chest x-ray. Electronically Signed   By: Bary RichardStan  Maynard M.D.   On: 01/24/2016 10:23    Anti-infectives: Anti-infectives     Start     Dose/Rate Route Frequency Ordered Stop   01/23/16 2015  ceFAZolin (ANCEF) IVPB 1 g/50 mL premix     1 g 100 mL/hr over 30 Minutes Intravenous  Once 01/23/16 2002 01/23/16 2142      Medications Scheduled Meds: . ALPRAZolam  0.5 mg Oral TID  . antiseptic oral rinse  7 mL Mouth Rinse q12n4p  . chlorhexidine  15 mL Mouth Rinse BID  . fentaNYL (SUBLIMAZE) injection  50 mcg Intravenous Once  .  HYDROmorphone (DILAUDID) injection  1 mg Intravenous Once  . lidocaine (PF)  5 mL Infiltration Once  . morphine   Intravenous Q4H  . pantoprazole  20 mg Oral Daily   Continuous Infusions: . sodium chloride 75 mL/hr at 01/24/16 1937   PRN Meds:.diazepam, diphenhydrAMINE **OR** diphenhydrAMINE, naloxone **AND** sodium chloride flush, ondansetron **OR** ondansetron (ZOFRAN) IV, oxyCODONE  Assessment/Plan: s/p Procedure(s): ANTERIOR CERVICAL DECOMPRESSION/DISCECTOMY FUSION Cervical five - six -continue ambulate -continue PCA for pain, add oral meds -possible transfer to floor  LOS: 2 days   De BlanchLuke Aaron Kinsinger Trauma Surgeon [phone]--office [facility] 01/25/2016
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
## 2016-01-26 ENCOUNTER — Encounter (HOSPITAL_COMMUNITY): Payer: Self-pay | Admitting: Neurosurgery

## 2016-01-26 ENCOUNTER — Inpatient Hospital Stay (HOSPITAL_COMMUNITY): Payer: Commercial Managed Care - PPO

## 2016-01-26 DIAGNOSIS — S22079A Unspecified fracture of T9-T10 vertebra, initial encounter for closed fracture: Secondary | ICD-10-CM | POA: Diagnosis present

## 2016-01-26 DIAGNOSIS — D62 Acute posthemorrhagic anemia: Secondary | ICD-10-CM | POA: Diagnosis not present

## 2016-01-26 DIAGNOSIS — S2231XA Fracture of one rib, right side, initial encounter for closed fracture: Secondary | ICD-10-CM | POA: Diagnosis present

## 2016-01-26 MED ORDER — OXYCODONE HCL 5 MG PO TABS
5.0000 mg | ORAL_TABLET | ORAL | Status: DC | PRN
  Administered 2016-01-26: 15 mg via ORAL
  Administered 2016-01-26: 5 mg via ORAL
  Administered 2016-01-27: 10 mg via ORAL
  Administered 2016-01-27: 15 mg via ORAL
  Administered 2016-01-27 (×2): 10 mg via ORAL
  Administered 2016-01-27: 15 mg via ORAL
  Filled 2016-01-26: qty 2
  Filled 2016-01-26: qty 3
  Filled 2016-01-26: qty 2
  Filled 2016-01-26: qty 1
  Filled 2016-01-26: qty 3
  Filled 2016-01-26: qty 2
  Filled 2016-01-26: qty 3
  Filled 2016-01-26: qty 2

## 2016-01-26 MED ORDER — MORPHINE SULFATE (PF) 2 MG/ML IV SOLN
2.0000 mg | INTRAVENOUS | Status: DC | PRN
  Administered 2016-01-26 – 2016-01-28 (×2): 2 mg via INTRAVENOUS
  Filled 2016-01-26 (×2): qty 1

## 2016-01-26 MED ORDER — NAPROXEN 250 MG PO TABS
500.0000 mg | ORAL_TABLET | Freq: Two times a day (BID) | ORAL | Status: DC
  Administered 2016-01-26 – 2016-01-27 (×3): 500 mg via ORAL
  Filled 2016-01-26 (×3): qty 2

## 2016-01-26 MED ORDER — DOCUSATE SODIUM 100 MG PO CAPS
100.0000 mg | ORAL_CAPSULE | Freq: Two times a day (BID) | ORAL | Status: DC
  Administered 2016-01-26 – 2016-01-27 (×3): 100 mg via ORAL
  Filled 2016-01-26 (×2): qty 1

## 2016-01-26 MED ORDER — POLYETHYLENE GLYCOL 3350 17 G PO PACK
17.0000 g | PACK | Freq: Every day | ORAL | Status: DC
  Administered 2016-01-26 – 2016-01-27 (×2): 17 g via ORAL
  Filled 2016-01-26: qty 1

## 2016-01-26 MED ORDER — ENOXAPARIN SODIUM 40 MG/0.4ML ~~LOC~~ SOLN
40.0000 mg | Freq: Every day | SUBCUTANEOUS | Status: DC
  Administered 2016-01-26 – 2016-02-01 (×6): 40 mg via SUBCUTANEOUS
  Filled 2016-01-26 (×7): qty 0.4
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
## 2016-01-26 NOTE — Progress Notes (Signed)
Orthopedic Tech Progress Note Patient Details:  Jerry SpeedJimmy Hank 11/14/1980 [id number]  Ortho Devices Type of Ortho Device: CAM walker Ortho Device/Splint Interventions: Application   Saul FordyceJennifer C Kateryn Marasigan 01/26/2016, 3:46 PM
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
## 2016-01-26 NOTE — Progress Notes (Signed)
Patient ID: Jerry SpeedJimmy Chen, male   DOB: 10/26/1980, 35 y.o.   MRN: [id number] BP 105/63 mmHg  Pulse 69  Temp(Src) 98.1 F (36.7 C) (Oral)  Resp 20  Ht 5\' 10"  (1.778 m)  Wt 84.369 kg (186 lb)  BMI 26.69 kg/m2  SpO2 100% Alert and oriented x 4, speech is clear, fluent Moving all extremities Cervical wounds clean, dry, no signs of infection Newly discovered left lateral malleolus fracture, minimally displaced.  Taking po's for pain I took paperwork from wife to fill out. Will return the paperwork tomorrow.

## 2016-01-27 MED ORDER — DIATRIZOATE MEGLUMINE & SODIUM 66-10 % PO SOLN
15.0000 mL | ORAL | Status: AC
  Administered 2016-01-27 (×2): 15 mL via ORAL
  Filled 2016-01-27: qty 30

## 2016-01-27 MED ORDER — METHOCARBAMOL 500 MG PO TABS
500.0000 mg | ORAL_TABLET | Freq: Three times a day (TID) | ORAL | Status: DC | PRN
  Administered 2016-01-27 (×2): 500 mg via ORAL
  Filled 2016-01-27 (×2): qty 1

## 2016-01-27 MED ORDER — PROCHLORPERAZINE EDISYLATE 5 MG/ML IJ SOLN
5.0000 mg | Freq: Four times a day (QID) | INTRAMUSCULAR | Status: DC | PRN
  Administered 2016-01-27 – 2016-01-28 (×2): 5 mg via INTRAVENOUS
  Filled 2016-01-27 (×2): qty 2

## 2016-01-27 MED ORDER — SORBITOL 70 % SOLN
960.0000 mL | TOPICAL_OIL | Freq: Once | ORAL | Status: AC
  Administered 2016-01-27: 960 mL via RECTAL
  Filled 2016-01-27: qty 240
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
## 2016-01-27 NOTE — Progress Notes (Signed)
Pt reports that he did have a loose, liquid stool after initially going to bathroom with no results after enema.  No form stool noted.  Jamelle RushingMichael Jeffreys notified.  Will continue with CT of abdomen with contrast.

## 2016-01-27 NOTE — Progress Notes (Signed)
Patient ID: Erskine SpeedJimmy Karpowicz, male   DOB: 02/29/1981, 35 y.o.   MRN: [id number] BP 128/83 mmHg  Pulse 73  Temp(Src) 98.7 F (37.1 C) (Oral)  Resp 16  Ht 5\' 10"  (1.778 m)  Wt 84.369 kg (186 lb)  BMI 26.69 kg/m2  SpO2 99% Alert and oriented Abdominal pain, enema minimally successful at this time. Will be going for CT abdomen. Has had emesis tonight. Moving all extremities well Wounds healing well.
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
## 2016-01-28 ENCOUNTER — Inpatient Hospital Stay (HOSPITAL_COMMUNITY): Payer: Commercial Managed Care - PPO

## 2016-01-28 ENCOUNTER — Encounter (HOSPITAL_COMMUNITY): Payer: Self-pay | Admitting: Radiology

## 2016-01-28 DIAGNOSIS — K5981 Ogilvie syndrome: Secondary | ICD-10-CM | POA: Diagnosis not present

## 2016-01-28 DIAGNOSIS — K598 Other specified functional intestinal disorders: Secondary | ICD-10-CM

## 2016-01-28 MED ORDER — IOPAMIDOL (ISOVUE-300) INJECTION 61%
INTRAVENOUS | Status: AC
  Administered 2016-01-28: 100 mL
  Filled 2016-01-28: qty 100

## 2016-01-28 MED ORDER — PANTOPRAZOLE SODIUM 40 MG IV SOLR
40.0000 mg | INTRAVENOUS | Status: DC
  Administered 2016-01-28 – 2016-01-30 (×3): 40 mg via INTRAVENOUS
  Filled 2016-01-28 (×3): qty 40

## 2016-01-28 MED ORDER — LORAZEPAM 2 MG/ML IJ SOLN
0.5000 mg | INTRAMUSCULAR | Status: DC | PRN
  Administered 2016-01-28 – 2016-01-31 (×5): 0.5 mg via INTRAVENOUS
  Filled 2016-01-28 (×5): qty 1

## 2016-01-28 MED ORDER — WHITE PETROLATUM GEL
Status: AC
  Administered 2016-01-28: 16:00:00
  Filled 2016-01-28: qty 1

## 2016-01-28 MED ORDER — SODIUM CHLORIDE 0.45 % IV SOLN
INTRAVENOUS | Status: DC
  Administered 2016-01-28 – 2016-01-30 (×6): via INTRAVENOUS
  Filled 2016-01-28 (×9): qty 1000

## 2016-01-28 MED ORDER — NEOSTIGMINE METHYLSULFATE 10 MG/10ML IV SOLN
2.0000 mg | Freq: Once | INTRAVENOUS | Status: AC
  Administered 2016-01-28: 2 mg via INTRAVENOUS
  Filled 2016-01-28: qty 2

## 2016-01-28 MED ORDER — MORPHINE SULFATE (PF) 2 MG/ML IV SOLN
2.0000 mg | INTRAVENOUS | Status: DC | PRN
  Administered 2016-01-28 (×2): 2 mg via INTRAVENOUS
  Administered 2016-01-28 (×2): 4 mg via INTRAVENOUS
  Administered 2016-01-28: 2 mg via INTRAVENOUS
  Administered 2016-01-28 – 2016-01-29 (×2): 4 mg via INTRAVENOUS
  Administered 2016-01-29: 2 mg via INTRAVENOUS
  Administered 2016-01-29 – 2016-01-30 (×7): 4 mg via INTRAVENOUS
  Filled 2016-01-28: qty 2
  Filled 2016-01-28 (×2): qty 1
  Filled 2016-01-28: qty 2
  Filled 2016-01-28: qty 1
  Filled 2016-01-28 (×5): qty 2
  Filled 2016-01-28: qty 1
  Filled 2016-01-28 (×4): qty 2

## 2016-01-28 MED ORDER — IOPAMIDOL (ISOVUE-300) INJECTION 61%: 100.0000 mL | Freq: Once | INTRAVENOUS | Status: DC | PRN

## 2016-01-28 NOTE — Progress Notes (Signed)
Patient given Bloxiverz, Bp before medication admin was 138/76 HR 86, After admin the BP was 131/74, HR 84 taken on dinamap

## 2016-01-28 NOTE — Progress Notes (Signed)
Patient refused all oral medications stating that "it will make him vomit and it hurt more when he vomits."

## 2016-01-28 NOTE — Progress Notes (Signed)
Patient wants to hold off on getting NG tube placed. States that he is not nauseous right now.
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
## 2016-01-28 NOTE — Progress Notes (Signed)
PT Cancellation Note  Patient Details Name: Jerry Chen MRN: [id number] DOB: 07/10/1981   Cancelled Treatment:    Reason Eval/Treat Not Completed: Fatigue/lethargy limiting ability to participate;Pain limiting ability to participate; too much abdominal pain and now too sedated with pain medication to participate.  RN reports to get NG tube today.  Will check back tomorrow.   Elray McgregorCynthia Wynn 01/28/2016, 11:10 AM  Sheran Lawlessyndi Wynn, PT [phone] 01/28/2016
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
## 2016-01-28 NOTE — Progress Notes (Signed)
Patient states that he has been nauseous and can not eat, drink or take meds without vomiting. Called the Trauma PA on call to have needs switched from PO to IV.

## 2016-01-28 NOTE — Progress Notes (Signed)
Patient ID: Jerry Chen, male   DOB: 03/24/1981, 35 y.o.   MRN: [id number] BP 129/78 mmHg  Pulse 88  Temp(Src) 99.5 F (37.5 C) (Oral)  Resp 17  Ht 5\' 10"  (1.778 m)  Wt 84.369 kg (186 lb)  BMI 26.69 kg/m2  SpO2 96% Alert and oriented x 4, speech is clear and fluent Moving all extremities Cervical wounds are clean, dry, and without signs of infection Gi situation is improved, though not resolved.  No new recs

## 2016-01-29 ENCOUNTER — Inpatient Hospital Stay (HOSPITAL_COMMUNITY): Payer: Commercial Managed Care - PPO

## 2016-01-29 MED ORDER — NEOSTIGMINE METHYLSULFATE 10 MG/10ML IV SOLN
2.0000 mg | Freq: Once | INTRAVENOUS | Status: AC
  Administered 2016-01-29: 2 mg via INTRAVENOUS
  Filled 2016-01-29: qty 2
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
## 2016-01-30 MED ORDER — OXYCODONE HCL 5 MG PO TABS
5.0000 mg | ORAL_TABLET | ORAL | Status: DC | PRN
  Administered 2016-01-30 – 2016-02-01 (×10): 15 mg via ORAL
  Filled 2016-01-30 (×11): qty 3

## 2016-01-30 MED ORDER — MORPHINE SULFATE (PF) 2 MG/ML IV SOLN
2.0000 mg | INTRAVENOUS | Status: DC | PRN
  Administered 2016-01-30 (×3): 2 mg via INTRAVENOUS
  Filled 2016-01-30 (×3): qty 1
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
## 2016-01-30 NOTE — Progress Notes (Signed)
Pt ambulated in the hallway 200 ft in the hallway with brace on and aligned. No noted distress. Will continue to monitor.

## 2016-01-30 NOTE — Progress Notes (Signed)
Patient ID: Jerry Chen, male   DOB: 03/14/1981, 35 y.o.   MRN: [id number] BP 123/74 mmHg  Pulse 69  Temp(Src) 98.6 F (37 C) (Oral)  Resp 17  Ht 5\' 10"  (1.778 m)  Wt 84.369 kg (186 lb)  BMI 26.69 kg/m2  SpO2 99% Alert and oriented x 4, speech is clear and fluent Moving well Wound is clean, dry, no signs of infection Discharge follow up on the chart.  No new recs, will sign off. But if there are questions do not hesitate to call.
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
## 2016-01-31 MED ORDER — HYPROMELLOSE (GONIOSCOPIC) 2.5 % OP SOLN
1.0000 [drp] | Freq: Four times a day (QID) | OPHTHALMIC | Status: DC | PRN
  Filled 2016-01-31: qty 15

## 2016-01-31 MED ORDER — POLYVINYL ALCOHOL 1.4 % OP SOLN
1.0000 [drp] | OPHTHALMIC | Status: DC | PRN
  Filled 2016-01-31: qty 15

## 2016-01-31 MED ORDER — POLYETHYLENE GLYCOL 3350 17 G PO PACK
17.0000 g | PACK | Freq: Two times a day (BID) | ORAL | Status: DC
  Administered 2016-01-31 – 2016-02-01 (×3): 17 g via ORAL
  Filled 2016-01-31 (×3): qty 1

## 2016-01-31 MED ORDER — PANTOPRAZOLE SODIUM 20 MG PO TBEC
20.0000 mg | DELAYED_RELEASE_TABLET | Freq: Every day | ORAL | Status: DC
  Administered 2016-01-31 – 2016-02-01 (×2): 20 mg via ORAL
  Filled 2016-01-31 (×2): qty 1
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
## 2016-02-01 MED ORDER — OXYCODONE HCL 5 MG PO TABS: 5.0000 mg | ORAL_TABLET | ORAL | Status: AC | PRN

## 2016-02-01 MED ORDER — POLYETHYLENE GLYCOL 3350 17 GM/SCOOP PO POWD
1.0000 | Freq: Once | ORAL | Status: AC
  Administered 2016-02-01: 255 g via ORAL
  Filled 2016-02-01: qty 255

## 2016-02-01 MED ORDER — POLYETHYLENE GLYCOL 3350 17 G PO PACK: 17.0000 g | PACK | Freq: Two times a day (BID) | ORAL | Status: AC

## 2016-02-01 MED ORDER — BISACODYL 10 MG RE SUPP: 10.0000 mg | Freq: Every day | RECTAL | Status: DC | PRN

## 2016-02-01 NOTE — Progress Notes (Signed)
Patient ready for discharge to home; discharge instructions given and reviewed; patient donned his left ankle boot and his neck and back brace; dressed for home; discharge home accompanied by his wife; follow up appointments reviewed with patient and his wife. Rx given at discharge.
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
## 2016-02-01 NOTE — Progress Notes (Signed)
Two bowel movements post miralax treatment this afternoon; wants to go home; patient has walked the entire nursing unit three times today independently. Patient requesting MD be paged for possible discharge tonight; awaiting call back.
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
## 2016-11-06 IMAGING — DX DG ABDOMEN ACUTE W/ 1V CHEST
3 series · 3 of 3 positions shown · non-contrast
Comparison: None.

CLINICAL DATA: Abdominal pain and distention.  Status post trauma.

EXAM:
DG ABDOMEN ACUTE W/ 1V CHEST

[w chest pa]
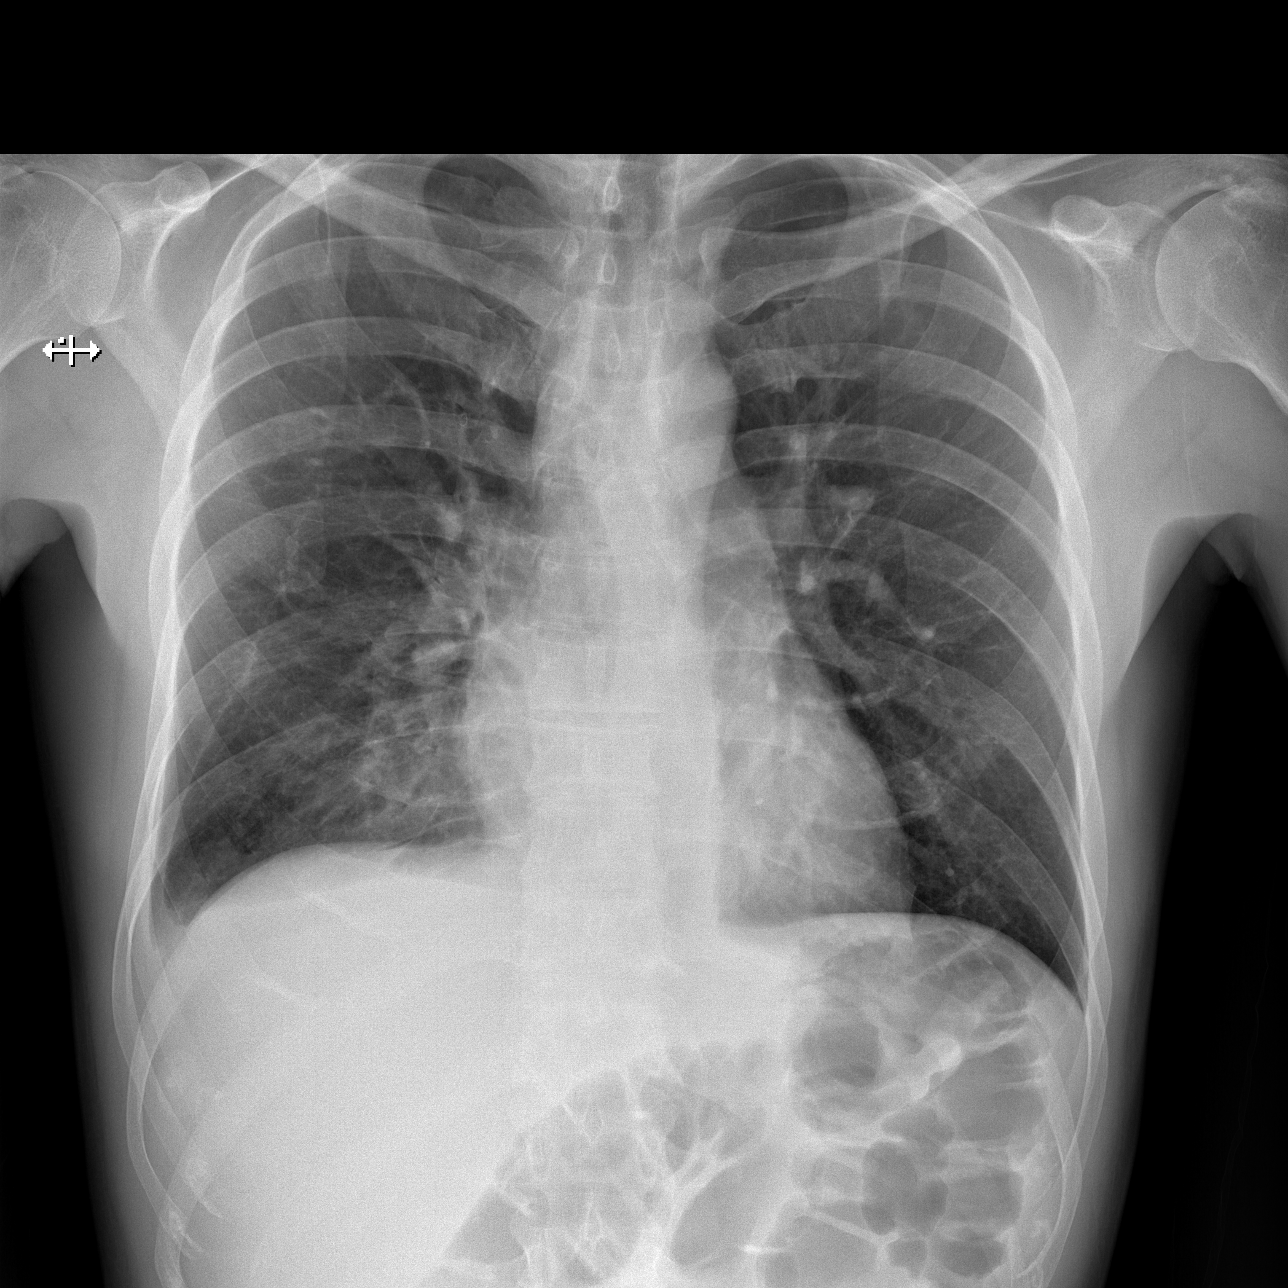

[w abdomen upright]
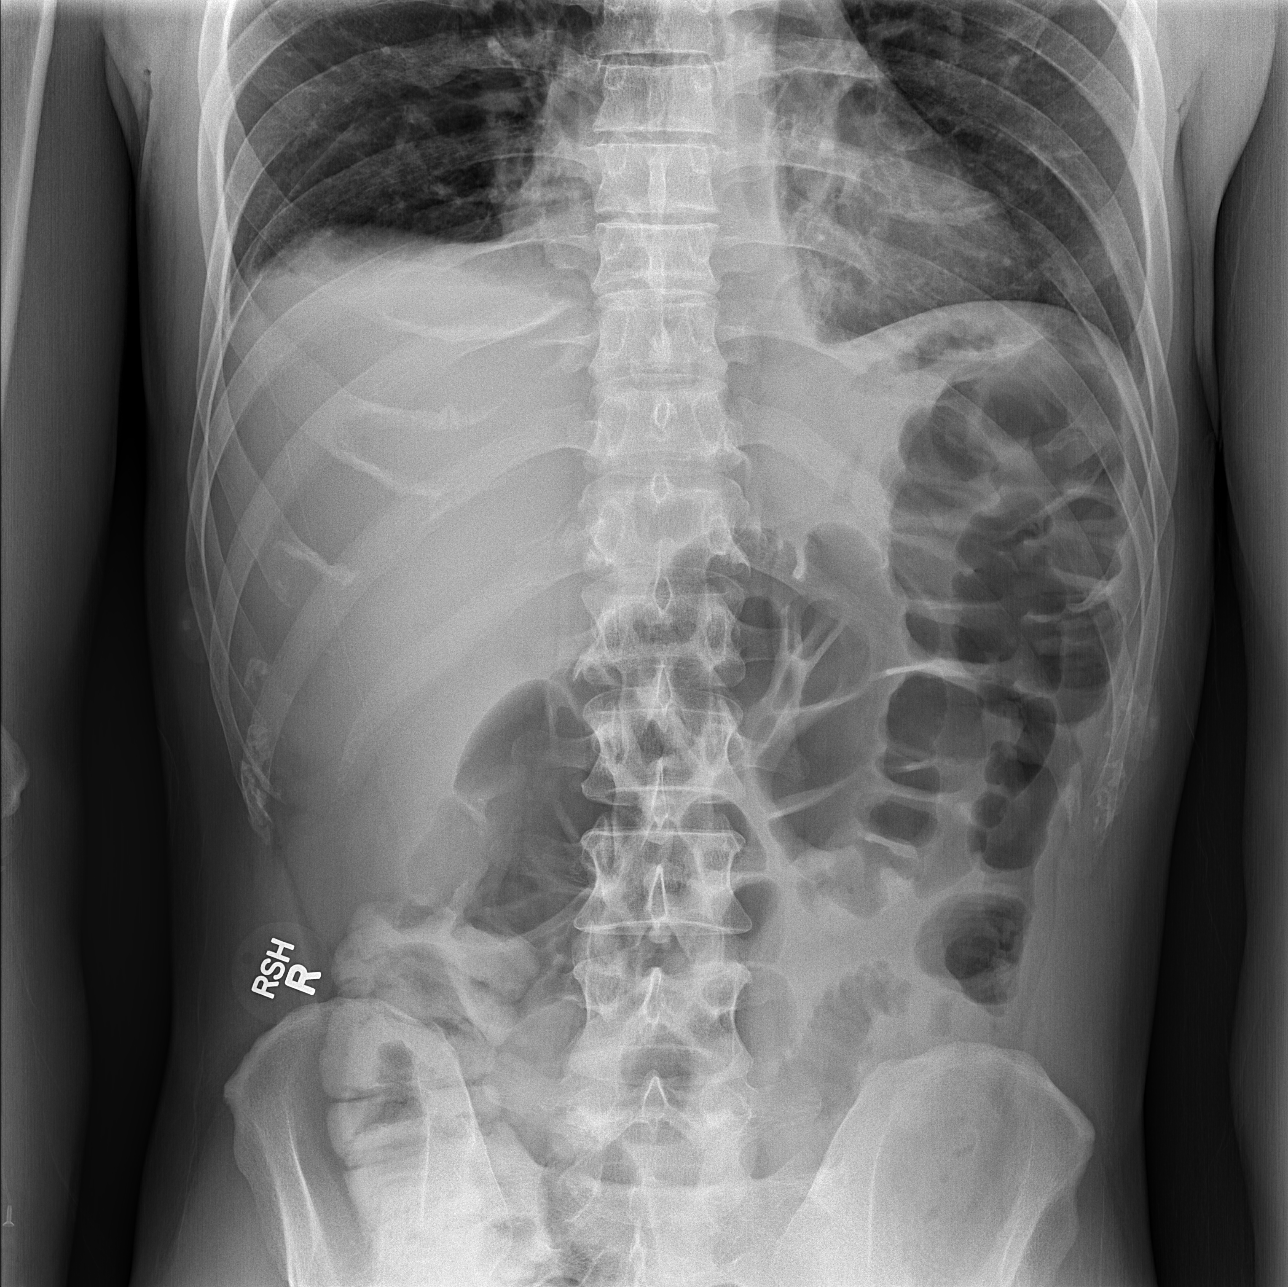

[t abdomen supine]
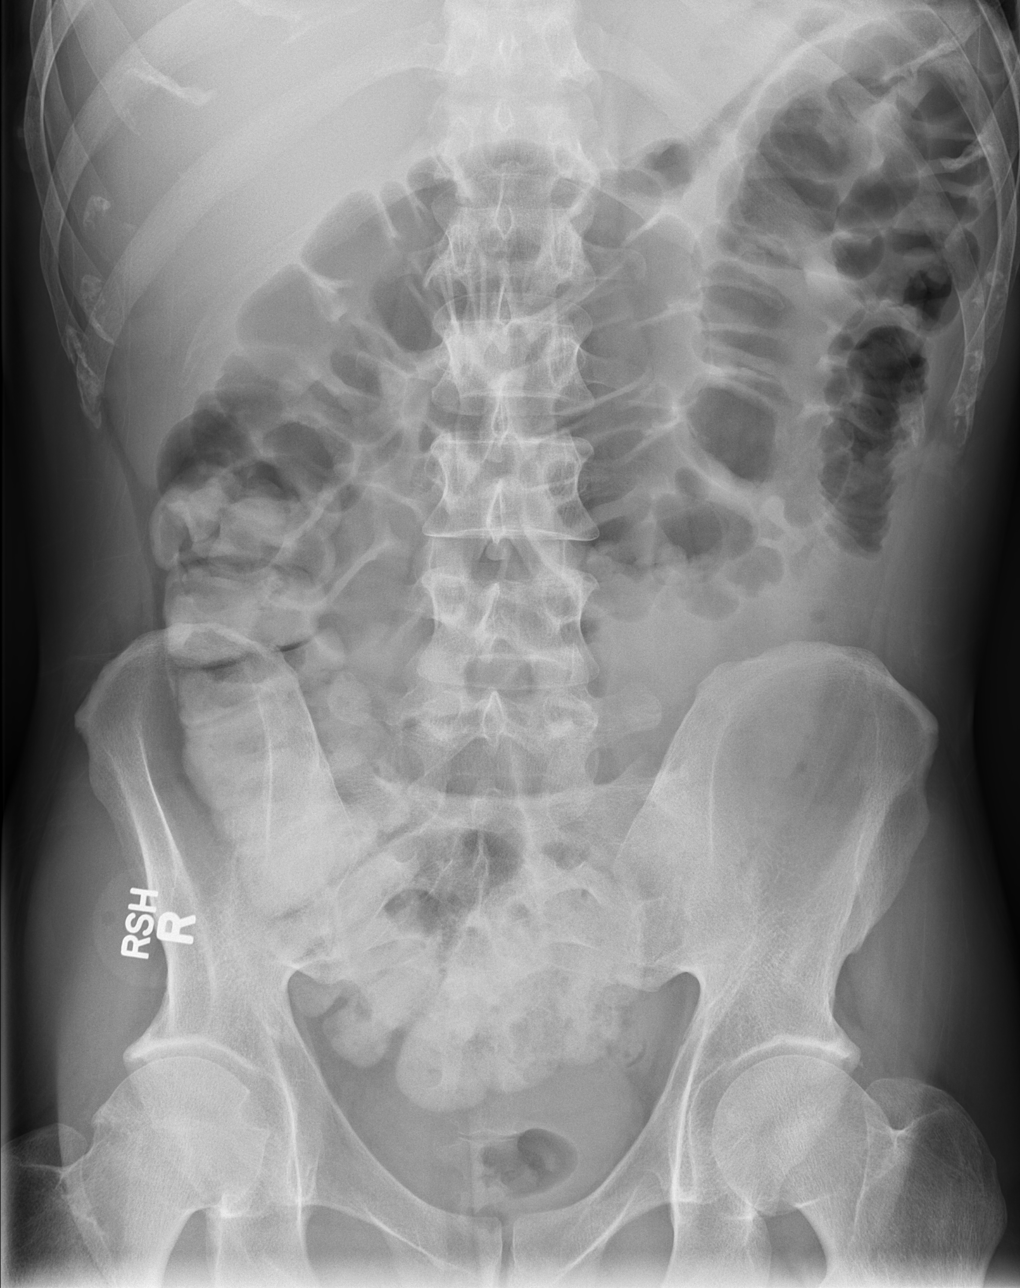

[3 of 3 positions shown; findings below may reference images not displayed]

FINDINGS: Normal heart size. There is a small right pleural effusion
identified. No airspace consolidation identified. Gaseous distension
of the large and small bowel loops are again noted compatible with
ileus.
IMPRESSION: 1. Persistent small right pleural effusion.
2. Ileus pattern, similar to previous exam.
